# Patient Record
Sex: Female | Born: 1995 | Race: White | Hispanic: No | Marital: Single
Health system: Southern US, Community
[De-identification: ages and names within clinical notes are randomized; demographics above are authoritative.]

---
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
## 2018-05-02 ENCOUNTER — Other Ambulatory Visit: Payer: Self-pay

## 2018-05-02 ENCOUNTER — Emergency Department
Admission: EM | Admit: 2018-05-02 | Discharge: 2018-05-02 | Disposition: A | Discharge status: Home / Self Care | Payer: Medicaid Other | Attending: Emergency Medicine | Admitting: Emergency Medicine

## 2018-05-02 ENCOUNTER — Encounter: Payer: Self-pay | Admitting: Radiology

## 2018-05-02 ENCOUNTER — Emergency Department: Payer: Medicaid Other

## 2018-05-02 DIAGNOSIS — L03213 Periorbital cellulitis: Secondary | ICD-10-CM

## 2018-05-02 DIAGNOSIS — H05221 Edema of right orbit: Secondary | ICD-10-CM | POA: Diagnosis present

## 2018-05-02 LAB — COMPREHENSIVE METABOLIC PANEL
ALT: 82 U/L — ABNORMAL HIGH (ref 0–44)
AST: 69 U/L — ABNORMAL HIGH (ref 15–41)
Albumin: 4.5 g/dL (ref 3.5–5.0)
Alkaline Phosphatase: 78 U/L (ref 38–126)
Anion gap: 11 (ref 5–15)
BUN: 14 mg/dL (ref 6–20)
CALCIUM: 10.1 mg/dL (ref 8.9–10.3)
CO2: 27 mmol/L (ref 22–32)
Chloride: 99 mmol/L (ref 98–111)
Creatinine, Ser: 0.9 mg/dL (ref 0.44–1.00)
GFR calc non Af Amer: >60 mL/min (ref >60)
Glucose, Bld: 112 mg/dL — ABNORMAL HIGH (ref 70–99)
Potassium: 4.1 mmol/L (ref 3.5–5.1)
Sodium: 137 mmol/L (ref 135–145)
Total Bilirubin: 0.9 mg/dL (ref 0.3–1.2)
Total Protein: 8.8 g/dL — ABNORMAL HIGH (ref 6.5–8.1)

## 2018-05-02 LAB — CBC
HCT: 41 % (ref 35.0–47.0)
HEMOGLOBIN: 14.4 g/dL (ref 12.0–16.0)
MCH: 30 pg (ref 26.0–34.0)
MCHC: 35.1 g/dL (ref 32.0–36.0)
MCV: 85.4 fL (ref 80.0–100.0)
Platelets: 199 10*3/uL (ref 150–440)
RBC: 4.8 MIL/uL (ref 3.80–5.20)
RDW: 12.5 % (ref 11.5–14.5)
WBC: 7.3 10*3/uL (ref 3.6–11.0)

## 2018-05-02 MED ORDER — CLINDAMYCIN HCL 300 MG PO CAPS: 300.0000 mg | ORAL_CAPSULE | Freq: Three times a day (TID) | ORAL | 0 refills | Status: AC

## 2018-05-02 MED ORDER — IOPAMIDOL (ISOVUE-300) INJECTION 61%
75.0000 mL | Freq: Once | INTRAVENOUS | Status: AC | PRN
  Administered 2018-05-02: 75 mL via INTRAVENOUS

## 2018-05-02 MED ORDER — SODIUM CHLORIDE 0.9 % IV SOLN
1.0000 g | Freq: Once | INTRAVENOUS | Status: AC
  Administered 2018-05-02: 1 g via INTRAVENOUS
  Filled 2018-05-02: qty 10

## 2018-05-02 MED ORDER — AMOXICILLIN-POT CLAVULANATE 875-125 MG PO TABS: 1.0000 | ORAL_TABLET | Freq: Two times a day (BID) | ORAL | 0 refills | Status: AC

## 2018-05-02 NOTE — ED Provider Notes (Signed)
[facility] Emergency Department Provider Note    First MD Initiated Contact with Patient 05/02/18 [phone]     (approximate)  I have reviewed the triage vital signs and the nursing notes.   HISTORY  Chief Complaint Eye Problem    HPI Jody Montoya is a 22 y.o. female presents emergency department with history of having 8 styes in the last 2 months.  Patient now presenting with right periorbital swelling and redness times a day.  Patient denies any fever.  Past medical history Multiple styes  There are no active problems to display for this patient.     Prior to Admission medications   Medication Sig Start Date End Date Taking? Authorizing Provider  amoxicillin-clavulanate (AUGMENTIN) 875-125 MG tablet Take 1 tablet by mouth 2 (two) times daily for 10 days. 05/02/18 05/12/18  Darci Current, MD  clindamycin (CLEOCIN) 300 MG capsule Take 1 capsule (300 mg total) by mouth 3 (three) times daily for 10 days. 05/02/18 05/12/18  Darci Current, MD    Allergies No known drug allergies No family history on file.  Social History Social History   Tobacco Use  . Smoking status: Not on file  Substance Use Topics  . Alcohol use: Not on file  . Drug use: Not on file    Review of Systems Constitutional: No fever/chills Eyes: No visual changes.  Positive for right eye lid swelling and redness. ENT: No sore throat. Cardiovascular: Denies chest pain. Respiratory: Denies shortness of breath. Gastrointestinal: No abdominal pain.  No nausea, no vomiting.  No diarrhea.  No constipation. Genitourinary: Negative for dysuria. Musculoskeletal: Negative for neck pain.  Negative for back pain. Integumentary: Negative for rash. Neurological: Negative for headaches, focal weakness or numbness.  ____________________________________________   PHYSICAL EXAM:  VITAL SIGNS: ED Triage Vitals  Enc Vitals Group     BP 05/02/18 0543 122/72     Pulse Rate 05/02/18  0543 (!) 108     Resp 05/02/18 0543 18     Temp 05/02/18 0543 99.2 F (37.3 C)     Temp Source 05/02/18 0543 Oral     SpO2 05/02/18 0543 99 %     Weight 05/02/18 0544 81.6 kg (180 lb)     Height 05/02/18 0544 1.727 m (5\' 8" )     Head Circumference --      Peak Flow --      Pain Score 05/02/18 0543 7     Pain Loc --      Pain Edu? --      Excl. in GC? --     Constitutional: Alert and oriented. Well appearing and in no acute distress. Eyes: Right conjunctival chemosis with edema of the upper and lower eyelid with associated periorbital erythema pain with downward gaze.   Head: Atraumatic. Ears:  Healthy appearing ear canals and TMs bilaterally Nose: No congestion/rhinnorhea. Mouth/Throat: Mucous membranes are moist. Oropharynx non-erythematous. Neck: No stridor.   Cardiovascular: Normal rate, regular rhythm. Good peripheral circulation. Grossly normal heart sounds. Respiratory: Normal respiratory effort.  No retractions. Lungs CTAB. Musculoskeletal: No lower extremity tenderness nor edema. No gross deformities of extremities. Neurologic:  Normal speech and language. No gross focal neurologic deficits are appreciated.  Skin:  Skin is warm, dry and intact. No rash noted. Psychiatric: Mood and affect are normal. Speech and behavior are normal.  ____________________________________________   LABS (all labs ordered are listed, but only abnormal results are displayed)  Labs Reviewed  COMPREHENSIVE METABOLIC PANEL -  Abnormal; Notable for the following components:      Result Value   Glucose, Bld 112 (*)    Total Protein 8.8 (*)    AST 69 (*)    ALT 82 (*)    All other components within normal limits  CBC     RADIOLOGY I, [place], personally viewed and evaluated these images (plain radiographs) as part of my medical decision making, as well as reviewing the written report by the radiologist.  ED MD interpretation: Right periorbital soft tissue swelling without any  orbital involvement per radiologist  Official radiology report(s): Ct Orbits W Contrast  Result Date: 05/02/2018 CLINICAL DATA:  Proptosis.  Right periorbital cellulitis. EXAM: CT ORBITS WITH CONTRAST TECHNIQUE: Multidetector CT images was performed according to the standard protocol following intravenous contrast administration. CONTRAST:  69mL ISOVUE-300 IOPAMIDOL (ISOVUE-300) INJECTION 61% COMPARISON:  None. FINDINGS: Orbits: Extensive soft tissue swelling is present in the right periorbital soft tissues. This appears to be centered in the right superior eyelid. No discrete mass lesion is present. Extensive supraorbital and to lesser extent infraorbital soft tissue swelling is present. There is no postseptal inflammatory change. The left periorbital soft tissues are within normal limits. Postseptal orbits are normal bilaterally. There is no exophthalmos. Globes are normal bilaterally. The lenses are located. Intraorbital structures are within normal limits. Visualized sinuses: The paranasal sinuses and mastoid air cells are clear. Soft tissues: Dense of right periorbital soft tissue swelling as described. Soft tissues face visualized muscles of mastication are otherwise unremarkable. Nasopharynx and oropharynx are within normal limits. Limited intracranial: Within normal limits. IMPRESSION: 1. Extensive right periorbital soft tissue swelling appears to be centered in the right upper lid. No focal mass lesion or foreign body is present. 2. No postseptal invasion. 3. No exophthalmos. 4. No discrete drainable abscess. Electronically Signed   By: Marin Roberts M.D.   On: 05/02/2018 07:27      Procedures   ____________________________________________   INITIAL IMPRESSION / ASSESSMENT AND PLAN / ED COURSE  As part of my medical decision making, I reviewed the following data within the electronic MEDICAL RECORD NUMBER   22 year old female presenting to the emergency department above-stated history  and physical exam secondary to right eye swelling and discomfort.  Physical exam findings consistent with periorbital cellulitis CT scan of the orbits revealed no orbital involvement.  Patient given 1 g IV ceftriaxone in the emergency department will be prescribed clindamycin and Augmentin for home with referral to Dr. Brooke Dare ophthalmologist for further outpatient evaluation.  ___________________________________  FINAL CLINICAL IMPRESSION(S) / ED DIAGNOSES  Final diagnoses:  Periorbital cellulitis of right eye     MEDICATIONS GIVEN DURING THIS VISIT:  Medications  cefTRIAXone (ROCEPHIN) 1 g in sodium chloride 0.9 % 100 mL IVPB (0 g Intravenous Stopped 05/02/18 0725)  iopamidol (ISOVUE-300) 61 % injection 75 mL (75 mLs Intravenous Contrast Given 05/02/18 0706)     ED Discharge Orders         Ordered    clindamycin (CLEOCIN) 300 MG capsule  3 times daily     05/02/18 0734    amoxicillin-clavulanate (AUGMENTIN) 875-125 MG tablet  2 times daily     05/02/18 0734           Note:  This document was prepared using Dragon voice recognition software and may include unintentional dictation errors.    Darci Current, MD 05/02/18 [phone]

## 2018-05-02 NOTE — ED Notes (Signed)
Patient transported to CT 

## 2018-05-02 NOTE — ED Triage Notes (Signed)
Reports right eye swollen.  Reports dx'd with 8 styes over the past 2 months.

## 2019-08-02 IMAGING — CT CT ORBITS W/ CM
3 series · 14 of 47 positions shown, 16 images · IV contrast (iopamidol)
Comparison: None.

CLINICAL DATA: Proptosis.  Right periorbital cellulitis.

EXAM:
CT ORBITS WITH CONTRAST
TECHNIQUE: Multidetector CT images was performed according to the standard
protocol following intravenous contrast administration.
CONTRAST:  75mL 7RSYFZ-D00 IOPAMIDOL (7RSYFZ-D00) INJECTION 61%

[Series 3: orbits 2.0 h30s st · axial · 0.33mm/px · z∈[-99,-21]mm · 8 of 47 slices shown, 10 images]
[im 4/47  brain]
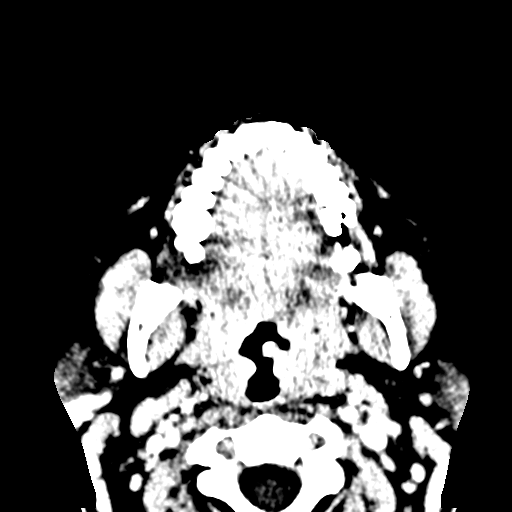
[im 4/47  bone]
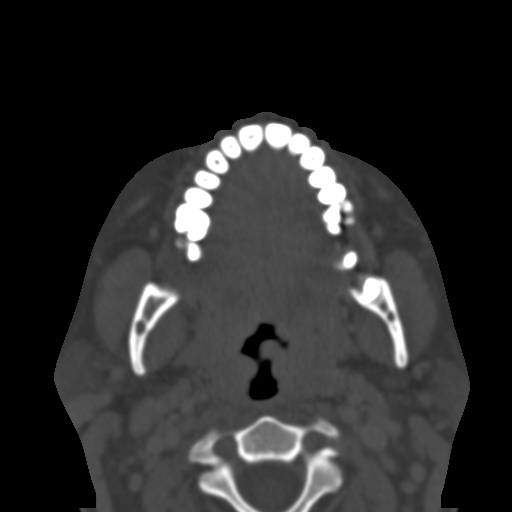
[im 10/47  bone]
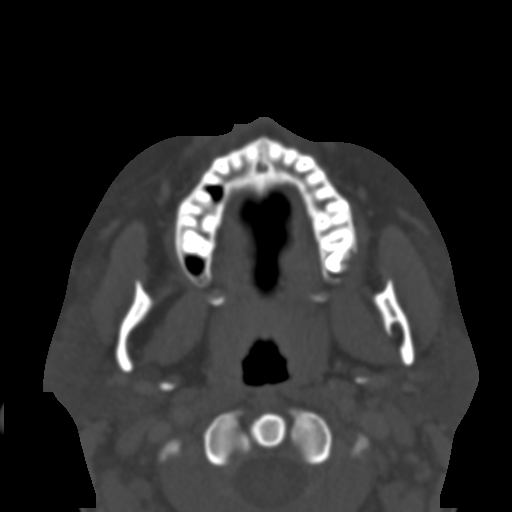
[im 15/47  bone]
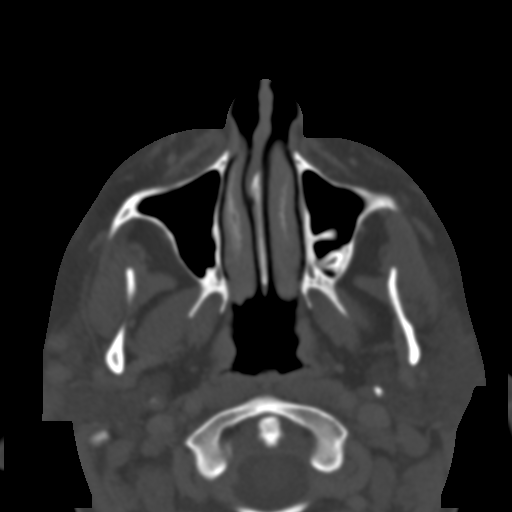
[im 21/47  bone]
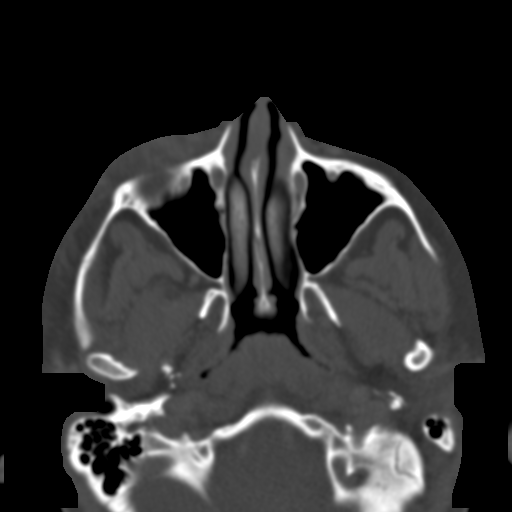
[im 26/47  brain]
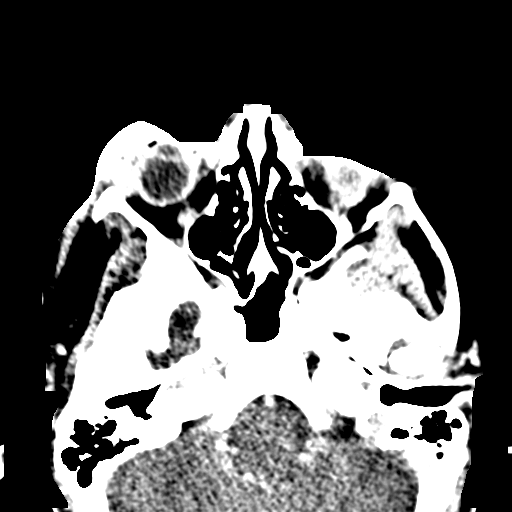
[im 26/47  bone]
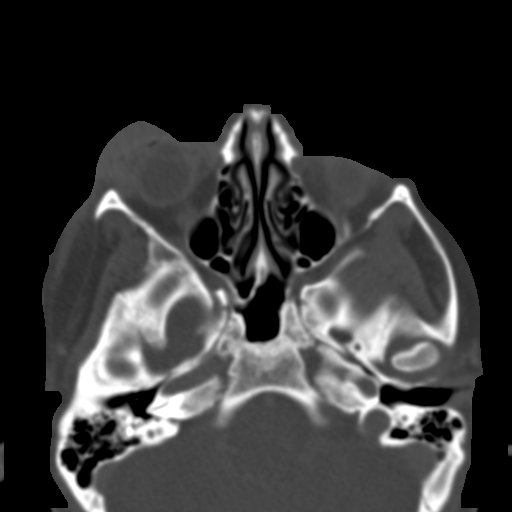
[im 32/47  bone]
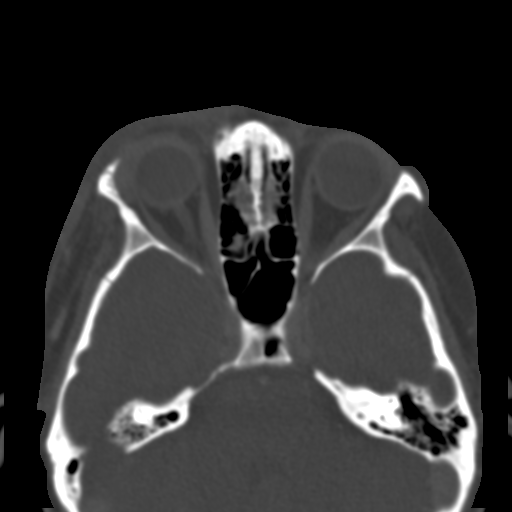
[im 37/47  bone]
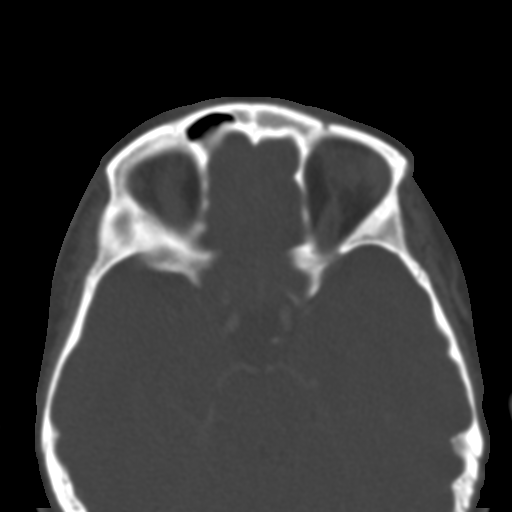
[im 43/47  bone]
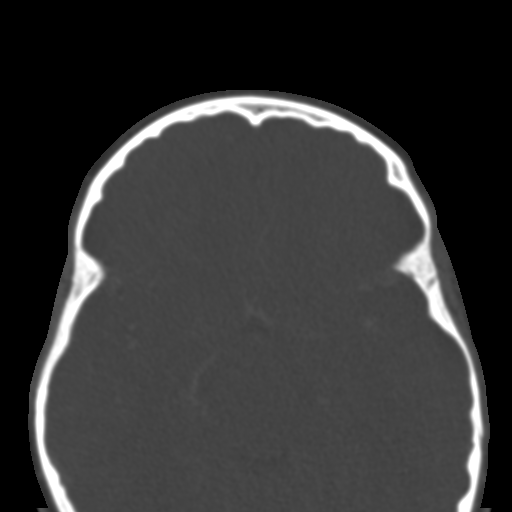

[Series 6: orbits 2.0 coronal · coronal · 0.20mm/px · 3 of 62 slices shown]
[im 21/62  bone]
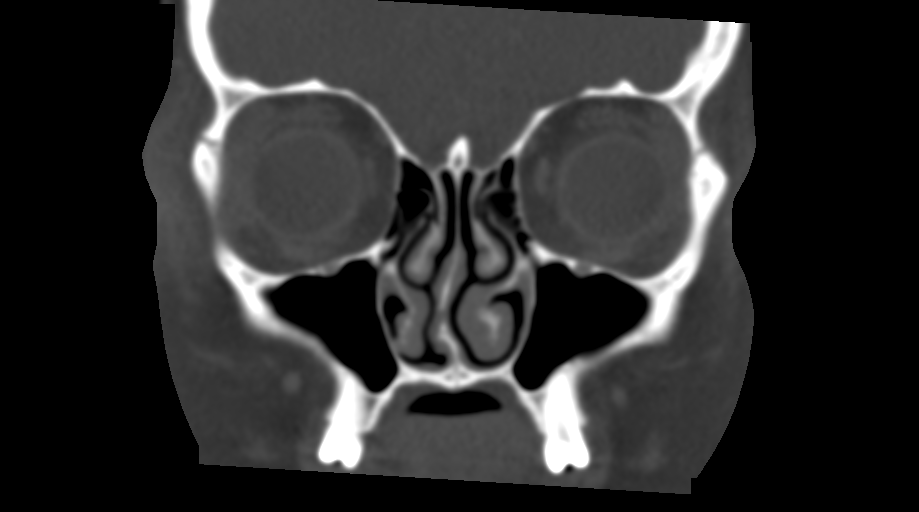
[im 28/62  bone]
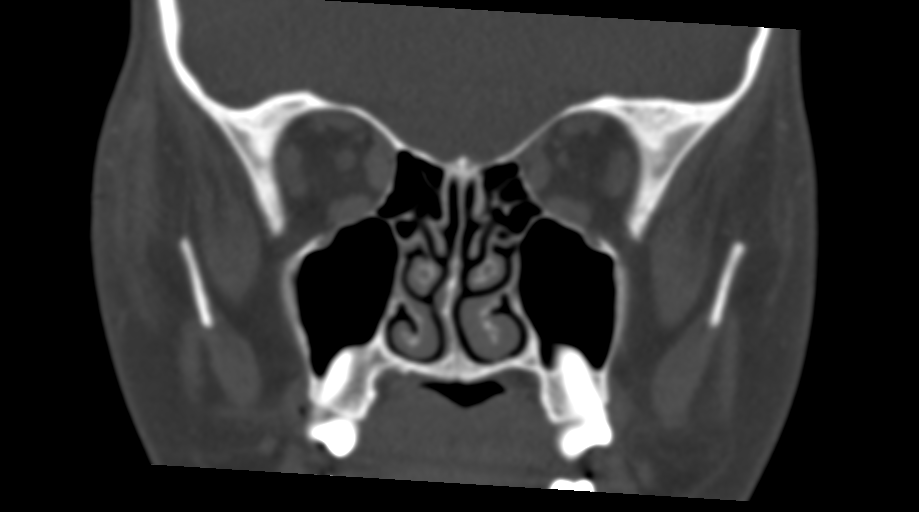
[im 34/62  bone]
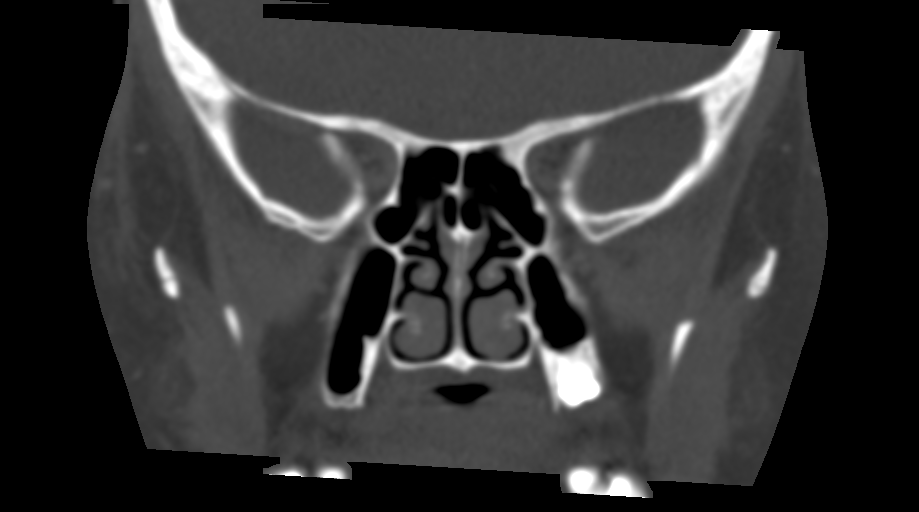

[Series 7: orbits 2.0 sagittal · sagittal · 0.20mm/px · 3 of 92 slices shown]
[im 31/92  bone]
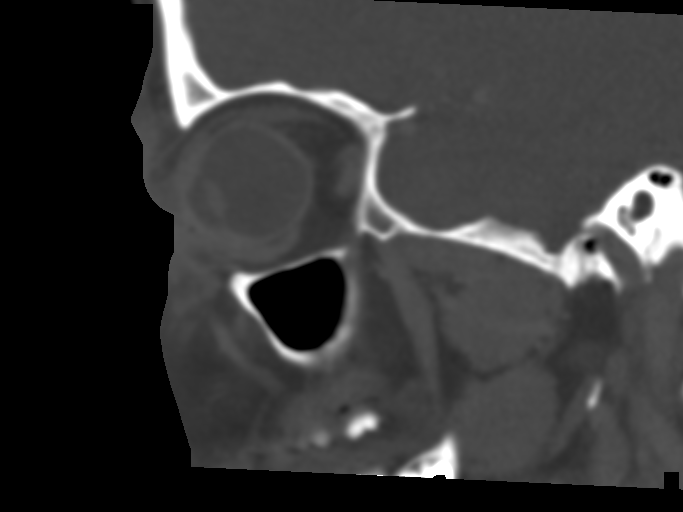
[im 46/92  bone]
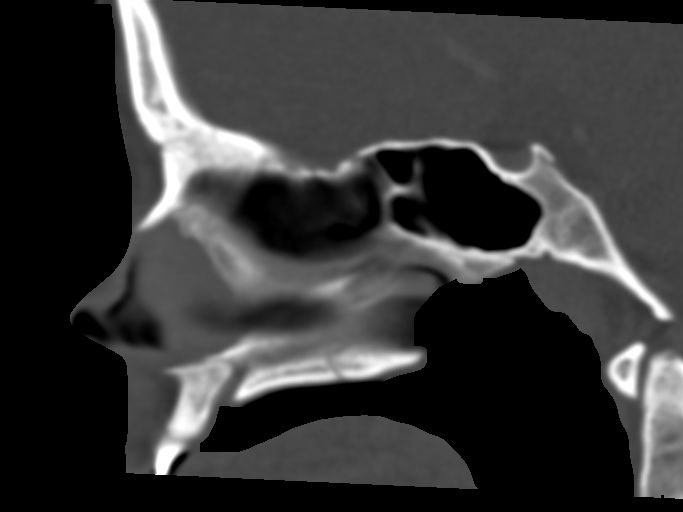
[im 61/92  bone]
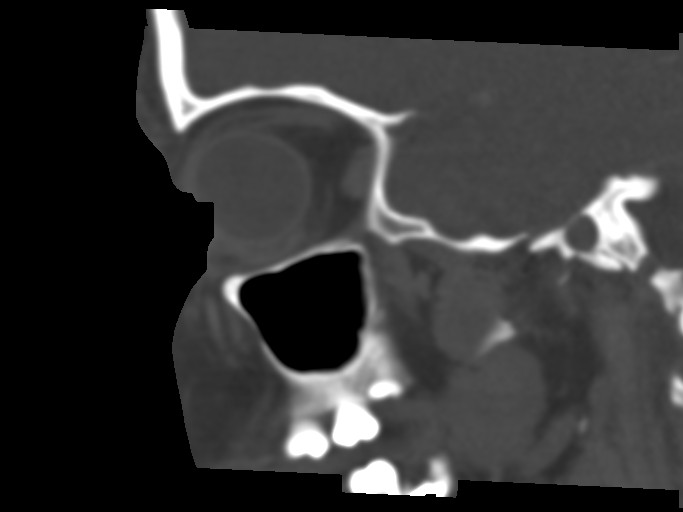

[14 of 47 positions shown; findings below may reference images not displayed]

FINDINGS: Orbits: Extensive soft tissue swelling is present in the right
periorbital soft tissues. This appears to be centered in the right
superior eyelid. No discrete mass lesion is present. Extensive
supraorbital and to lesser extent infraorbital soft tissue swelling
is present. There is no postseptal inflammatory change. The left
periorbital soft tissues are within normal limits. Postseptal orbits
are normal bilaterally. There is no exophthalmos. Globes are normal
bilaterally. The lenses are located. Intraorbital structures are
within normal limits.

Visualized sinuses: The paranasal sinuses and mastoid air cells are
clear.

Soft tissues: Dense of right periorbital soft tissue swelling as
described. Soft tissues face visualized muscles of mastication are
otherwise unremarkable. Nasopharynx and oropharynx are within normal
limits.

Limited intracranial: Within normal limits.
IMPRESSION: 1. Extensive right periorbital soft tissue swelling appears to be
centered in the right upper lid. No focal mass lesion or foreign
body is present.
2. No postseptal invasion.
3. No exophthalmos.
4. No discrete drainable abscess.
